# Patient Record
Sex: Female | Born: 1995
Health system: Southern US, Community
[De-identification: ages and names within clinical notes are randomized; demographics above are authoritative.]

## PROBLEM LIST (undated history)

## (undated) DIAGNOSIS — R569 Unspecified convulsions: Secondary | ICD-10-CM

## (undated) DIAGNOSIS — E119 Type 2 diabetes mellitus without complications: Secondary | ICD-10-CM

## (undated) HISTORY — PX: FACIAL RECONSTRUCTION SURGERY: SHX631

---
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
## 2016-06-29 ENCOUNTER — Encounter (HOSPITAL_COMMUNITY): Payer: Self-pay | Admitting: Obstetrics and Gynecology

## 2016-07-05 ENCOUNTER — Encounter (HOSPITAL_COMMUNITY): Payer: Self-pay

## 2016-07-05 ENCOUNTER — Ambulatory Visit (HOSPITAL_COMMUNITY): Admission: RE | Admit: 2016-07-05 | Payer: Self-pay | Source: Ambulatory Visit

## 2016-07-08 ENCOUNTER — Ambulatory Visit (HOSPITAL_COMMUNITY)
Admission: RE | Admit: 2016-07-08 | Discharge: 2016-07-08 | Disposition: A | Discharge status: Home / Self Care | Payer: Medicaid Other | Source: Ambulatory Visit | Attending: Obstetrics and Gynecology | Admitting: Obstetrics and Gynecology

## 2016-07-08 ENCOUNTER — Encounter (HOSPITAL_COMMUNITY): Payer: Self-pay

## 2016-07-08 VITALS — BP 118/86 | HR 102 | Wt 150.6 lb

## 2016-07-08 DIAGNOSIS — R569 Unspecified convulsions: Secondary | ICD-10-CM | POA: Diagnosis not present

## 2016-07-08 DIAGNOSIS — Z3A11 11 weeks gestation of pregnancy: Secondary | ICD-10-CM

## 2016-07-08 DIAGNOSIS — O99283 Endocrine, nutritional and metabolic diseases complicating pregnancy, third trimester: Secondary | ICD-10-CM | POA: Diagnosis not present

## 2016-07-08 DIAGNOSIS — Z880 Allergy status to penicillin: Secondary | ICD-10-CM | POA: Diagnosis not present

## 2016-07-08 DIAGNOSIS — E063 Autoimmune thyroiditis: Secondary | ICD-10-CM | POA: Insufficient documentation

## 2016-07-08 DIAGNOSIS — O24911 Unspecified diabetes mellitus in pregnancy, first trimester: Secondary | ICD-10-CM

## 2016-07-08 DIAGNOSIS — O24111 Pre-existing diabetes mellitus, type 2, in pregnancy, first trimester: Secondary | ICD-10-CM | POA: Insufficient documentation

## 2016-07-08 DIAGNOSIS — Z87442 Personal history of urinary calculi: Secondary | ICD-10-CM | POA: Diagnosis not present

## 2016-07-08 DIAGNOSIS — E119 Type 2 diabetes mellitus without complications: Secondary | ICD-10-CM | POA: Diagnosis not present

## 2016-07-08 DIAGNOSIS — O99351 Diseases of the nervous system complicating pregnancy, first trimester: Secondary | ICD-10-CM | POA: Diagnosis not present

## 2016-07-08 DIAGNOSIS — Z794 Long term (current) use of insulin: Secondary | ICD-10-CM | POA: Diagnosis not present

## 2016-07-08 HISTORY — DX: Type 2 diabetes mellitus without complications: E11.9

## 2016-07-08 HISTORY — DX: Unspecified convulsions: R56.9

## 2016-07-08 NOTE — Addendum Note (Signed)
Encounter addended by: Ledon SnareBrian Khylah Kendra, MD on: 07/08/2016  4:57 PM<BR>     Documentation filed: Notes Section

## 2016-07-08 NOTE — Progress Notes (Signed)
MFM Consult  20 year old, G3P0020, currently at 11 weeks 3 days gestation with EDD 01/24/2017 seen today for consultation for her diabetes  Past Medical History  Diagnosis Date  . Diabetes mellitus without complication (HCC)   . Seizures [facility](HCC)    Past Surgical History  Procedure Laterality Date  . Facial reconstruction surgery     OB History    Gravida Para Term Preterm AB TAB SAB Ectopic Multiple Living   3    2  2         Social History   Social History  . Marital Status: Single    Spouse Name: N/A  . Number of Children: N/A  . Years of Education: N/A   Occupational History  . Not on file.   Social History Main Topics  . Smoking status: Never Smoker   . Smokeless tobacco: Not on file  . Alcohol Use: No  . Drug Use: No  . Sexual Activity: Not on file   Other Topics Concern  . Not on file   Social History Narrative   Allergies  Allergen Reactions  . Benadryl [Diphenhydramine]   . Humalog [Insulin Lispro]   . Penicillins   . Seroquel [Quetiapine]   . Toradol [Ketorolac Tromethamine]    No prenatal records supplied by her providers office for the visit today.   Impression / Recommendations #1 Diabetes in pregnancy - She notes she was admitted to the hospital last week in [place] for diabetic education and patterning. She states she has felt great having her blood sugars under control for the first real time in her life.  - She states her HgbA1c was 9.5 when in the hospital. We discussed that this is associated with a significant risk of birth defects in the developing baby and that she should have a detailed fetal anatomic survey at ~[redacted] weeks gestation. - She is not sure if her providers will do this or if they will want it here. We would be glad to help schedule this if they desire the ultrasound here.  - She notes onset of diabetes at age 20 and regular poor glycemic control.  - She is aware of glucose targets for pregnancy and reports fasting glucose levels of  90-100 and 2-hour postprandials of 100-120. She is currently on levimer 35 units SQ BID and 17 units Humilin R SQ with meals - She will need an ophthamology examination, EKG and a creatinine with a baseline 24-hour urine for total protein. Also consider baseline preeclampsia labs. - She has had a formal dating scan and will set up a fetal anatomic survey as noted above - Serial monthly ultrasounds from ~24 weeks for evaluation of fetal growth and amniotic fluid volume - Start daily maternal assessment of fetal movement at ~[redacted] weeks gestation with immediate provider contact for decreased or cessation of fetal movement.  - Antenatal testing starting ~[redacted] weeks gestation  - Consider delivery at ~39 weeks if still undelivered #2 Seizures  - She has not been formally been evaluated or diagnosed with this. She notes onset of seizures ~1 year ago, when under extreme stress. She notes in the beginning, she would zone out when she had a seizure. Then they progressed to shaking. She denies loss of urine or stool with the episodes that were occuring up to 6 times a day. She was treating them with marijuana which seemed to make them less frequent. She has not had a seizure since April 2017 and hopes that they have gone away  with pregnancy. If they recur, it may be good to have a formal evaluation and treatment plan laid out by a neurologist.  #3 History of frequent UTI and kidney stones - She notes that her NOB urine culture was negative - Check urine cultures on an ~ monthly basis during pregnancy #4 Aneuploidy screening - She states she will be getting a nuchal translucency evaluation at her providers office next week.  #5 Hashimoto's thyroiditis - She notes a history of this and has not had a recent TSH. Would check this and treat as appropriate.   Questions appear answered to her satisfaction. Precautions for the above given.

## 2016-07-08 NOTE — Progress Notes (Signed)
Spent greater than 1/2 of 50 minute visit for MFM Consult face to face counseling

## 2016-08-25 ENCOUNTER — Other Ambulatory Visit (HOSPITAL_COMMUNITY): Payer: Self-pay | Admitting: Obstetrics and Gynecology

## 2016-08-25 DIAGNOSIS — Z3689 Encounter for other specified antenatal screening: Secondary | ICD-10-CM

## 2016-08-25 DIAGNOSIS — Z3A18 18 weeks gestation of pregnancy: Secondary | ICD-10-CM

## 2016-08-26 ENCOUNTER — Ambulatory Visit (HOSPITAL_COMMUNITY)
Admission: RE | Admit: 2016-08-26 | Discharge: 2016-08-26 | Disposition: A | Discharge status: Home / Self Care | Payer: Medicaid Other | Source: Ambulatory Visit | Attending: Obstetrics and Gynecology | Admitting: Obstetrics and Gynecology

## 2016-08-26 ENCOUNTER — Encounter (HOSPITAL_COMMUNITY): Payer: Self-pay

## 2016-08-26 DIAGNOSIS — O358XX0 Maternal care for other (suspected) fetal abnormality and damage, not applicable or unspecified: Secondary | ICD-10-CM

## 2016-08-26 DIAGNOSIS — O350XX0 Maternal care for (suspected) central nervous system malformation in fetus, not applicable or unspecified: Secondary | ICD-10-CM

## 2016-08-26 DIAGNOSIS — O3509X0 Maternal care for (suspected) other central nervous system malformation or damage in fetus, not applicable or unspecified: Secondary | ICD-10-CM

## 2016-08-26 DIAGNOSIS — O35AXX0 Maternal care for other (suspected) fetal abnormality and damage, fetal facial anomalies, not applicable or unspecified: Secondary | ICD-10-CM

## 2016-08-26 DIAGNOSIS — Z315 Encounter for genetic counseling: Secondary | ICD-10-CM | POA: Insufficient documentation

## 2016-08-26 DIAGNOSIS — Z3689 Encounter for other specified antenatal screening: Secondary | ICD-10-CM

## 2016-08-26 DIAGNOSIS — Z3A18 18 weeks gestation of pregnancy: Secondary | ICD-10-CM | POA: Insufficient documentation

## 2016-08-26 NOTE — Progress Notes (Signed)
[facility] CONSULT  Patient Name: Victoria Quinn Medical Record Number:  [id number] Date of Birth: 10/08/1996 Requesting Physician Name:  Sharlynn Oliphant, DO Date of Service: 08/26/2016  Chief Complaint Cleft lip/palate  History of Present Illness Victoria Quinn was seen today secondary to recent discovery of a cleft lip/palate on an ultrasound performed at her primary OBs office at the request of Sharlynn Oliphant, DO.  It also showed enlarged lateral ventricles.  The patient is a 20 y.o. G3P0020,at 70w3dwith an EDD of 01/24/2017, by Ultrasound dating method.  She also has long standing diabetes and [facility].  She reports her fastings and 2 hour post-prandials have been elevated most of the time, some of them have been greater than 200.  She has no other acute issues or complaints at this time.  Review of Systems Pertinent items are noted in HPI.  Patient History OB History  Gravida Para Term Preterm AB Living  3       2 0  SAB TAB Ectopic Multiple Live Births  2            # Outcome Date GA Lbr Len/2nd Weight Sex Delivery Anes PTL Lv  3 Current           2 SAB           1 SAB               Past Medical History:  Diagnosis Date  . Diabetes mellitus without complication ([facility]   . Seizures ([facility]     Past Surgical History:  Procedure Laterality Date  . FACIAL RECONSTRUCTION SURGERY      Social History   Social History  . Marital status: Single    Spouse name: N/A  . Number of children: N/A  . Years of education: N/A   Social History Main Topics  . Smoking status: Never Smoker  . Smokeless tobacco: Not on file  . Alcohol use No  . Drug use: No  . Sexual activity: Not on file   Other Topics Concern  . Not on file   Social History Narrative  . No narrative on file    No family history on file. In addition, the patient has no family history of mental retardation, birth defects, or genetic  diseases.  Physical Examination There were no vitals filed for this visit. General appearance - alert, well appearing, and in no distress Mental status - alert, oriented to person, place, and time Abdomen - soft, nontender, nondistended, no masses or organomegaly Extremities - no pedal edema noted  Assessment and Recommendations 1.  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  In addition, bilateral ventriculomegaly is seen (right 1.09 cm, left 1.14 cm). As these are the only abnormal findings on today's ultrasound, most likely the cleft lip/palate is isolated and not a part of a genetic syndrome or disorder.  However, this cannot be ruled out.  Later in pregnancy she may benefit from a prenatal consult with Pediatric ENT or Plastic Surgery.  She has met with our genetic counselor and has decided to proceed with MaterniGenome and expanded carrier screening.  Blood was drawn for those tests today. 2.  Diabetes.  Ms. TDuke Salviahas very poor glycemic control based on review of her glucose log.  It is bad enough that I think admission to the hospital for  more rapid insulin adjustments is the safest course of action.  Continuing with her current level of glycemic control carries the risk of DKA and fetal loss.  [name] declines admission at this time.  I urged her to discuss her diabetes management with her primary OB next visit (which is next week) and reconsider hospital admission.  Also, [name] clearly needs an endocrinologist.  I have placed a referral to an endocrinologist here in [place] as she has had difficulty getting and appointment with one closer to home.  I spent 30 minutes with [name] of which 50% was face-to-face counseling.  Thank you for referring [name].  Please do not hesitate to contact us with questions.   Jolyn Lent, MD

## 2016-08-29 ENCOUNTER — Other Ambulatory Visit (HOSPITAL_COMMUNITY): Payer: Self-pay | Admitting: *Deleted

## 2016-08-29 DIAGNOSIS — O35AXX0 Maternal care for other (suspected) fetal abnormality and damage, fetal facial anomalies, not applicable or unspecified: Secondary | ICD-10-CM

## 2016-08-29 DIAGNOSIS — O358XX0 Maternal care for other (suspected) fetal abnormality and damage, not applicable or unspecified: Secondary | ICD-10-CM

## 2016-08-29 DIAGNOSIS — O3509X0 Maternal care for (suspected) other central nervous system malformation or damage in fetus, not applicable or unspecified: Secondary | ICD-10-CM | POA: Insufficient documentation

## 2016-08-29 DIAGNOSIS — O350XX0 Maternal care for (suspected) central nervous system malformation in fetus, not applicable or unspecified: Secondary | ICD-10-CM | POA: Insufficient documentation

## 2016-08-29 NOTE — Progress Notes (Signed)
Genetic Counseling  High-Risk Gestation Note  Appointment Date:  08/26/2016 Referred By: Victoria Oliphant, DO Date of Birth:  1996-03-25   Pregnancy History: G3P0020 Estimated Date of Delivery: 01/24/17 Estimated Gestational Age: 49w3dAttending; Victoria Lent MD   I met with Ms. HRaynelle Charyfor genetic counseling because of previous ultrasound finding of cleft lip and mild ventriculomegaly in the patient's OB office.   In summary:  Discussed ultrasound findings in detail  Cleft lip and palate, mild ventriculomegaly  Patient has maternal grandfather with cleft lip; father of pregnancy reportedly has possible incomplete cleft lip  Patient has poorly controlled diabetes mellitus  Reviewed causes may include isolated, syndromic, genetic, chromosomal, environmental, multifactorial  Reviewed options for additional screening  NIPS- elected to pursue today ([place]  Fetal Echocardiogram-scheduled 09/16/16  Ongoing ultrasound- follow-up scheduled 09/23/16  Reviewed options for diagnostic testing, including risks, benefits, limitations and alternatives  Patient declined amniocentesis at this time  Reviewed family history concerns  We began by reviewing the ultrasound in detail. Ultrasound performed today visualized cleft lip and palate and mild, bilateral ventriculomegaly. Complete ultrasound results reported under separate cover. The patient was previously seen for MFM consultation earlier in pregnancy to discuss management of diabetes in pregnancy. The patient's medical records indicate that her diabetes has been in poor control throughout the pregnancy.   Both family histories were reviewed and found to be contributory for cleft lip. The patient reported her maternal grandfather has a history of cleft lip (unsure whether or not palate was also involved), and the father of the pregnancy was described to have a possible incomplete cleft lip, not requiring surgical  correction. The patient's grandfather reportedly also has a history of seizures and schizophrenia. The father of the pregnancy has scoliosis and is otherwise healthy. A specific etiology is not known for their cleft lips.   We discussed the ultrasound finding of cleft lip and palate. In normal embryological development, the fetal lip usually closes by 7-[redacted] weeks gestation and the fetal palate usually closes by [redacted] weeks gestation.  When parts of these structures do not fuse properly, cleft lip and/or palate (CL/P) results.  CL/P is twice as common in males as it is in females. Approximately 25% (1 in 4) of all cleft lip and/or palate (CL/P) cases are cleft lip only, 50% (1 in 2) are cleft lip and palate, and 25% (1 in 4) are cleft palate only.  The incidence of CL/P varies in different ethnic populations; it occurs in approximately 1 in 176,000Caucasian births.  In addition to ethnicity, other factors may increase the chance of a CL/P including some prenatal exposures, maternal diabetes, alcohol and drug use, cigarette smoking, or folic acid deficiency.  CL/P is most often an isolated condition, but can be present in combination with other birth defects possibly as part of a genetic syndrome. Approximately, 7-13% of individuals with a cleft lip and 11-14% of individuals with a cleft lip and palate are born with additional birth defects.  Many genetic syndromes are associated with cleft lip and/or palate which may not be identified on ultrasound and would not be detected by amniocentesis.  For this reason, a genetics evaluation may be recommended sometime after birth in order to assess for an underlying genetic syndrome.  When there is no syndrome as the cause, then the cleft lip or palate is typically suspected to be caused by a combination of genetic and environmental factors (multifactorial inheritance).   We discussed that ventriculomegaly refers to  an increased measurement of the lateral ventricles in the  brain greater than 10 mm. Possible causes for mild ventriculomegaly include overproduction of cerebrospinal fluid, a blockage in a duct causing the fluid to accumulate, an intrauterine infection, or a variation of normal. We discussed that once ventriculomegaly is identified in pregnancy, follow-up ultrasounds are offered to assess for progression of ventriculomegaly. Following delivery, post-natal studies may be required in order to track progress and assess for any other differences in brain anatomy. It was discussed that surgical intervention may be required in some cases of ventriculomegaly. Most cases of isolated mild ventriculomegaly do not have an identified cause or associated condition, tend to regress with time, and have no resulting health or learning problems. However, it is possible that this finding may be seen in association with other ultrasound differences or a genetic condition. There is also a slightly increased chance of developmental delays. We discussed that the presence of ventriculomegaly is associated with an increased risk for aneuploidy, specifically Down syndrome. We reviewed Victoria Quinn previously had first trimester screening, which reduced the risks for fetal Down syndrome, trisomy 57 and trisomy 13 to less than 1 in 10,000. We reviewed the sensitivity and specificity of this screening test.    We reviewed chromosomes and genes, and examples of chromosome conditions. We reviewed an available screening option, noninvasive prenatal testing (NIPT)/prenatal cell free DNA testing.  Specifically, we discussed that NIPT analyzes cell free placental DNA found in the maternal circulation. Specifically we discussed the option of MaterniTGenome through [employer]. This screen assesses cell free DNA from each chromosome in maternal blood and can detect deletions or duplications that are greater than or equal to 7Mb, in addition to select microdeletion syndromes (22q11, 15q11, 11q23, 8q24,  5p15, 4p16, and 1p36). The sensitivity of this test for gains/losses of chromosome material greater than 7Mb is reported to be >95%. We discussed limitations and benefits of this screen including that it is not diagnostic and cannot assess for all chromosome conditions nor assess for single gene conditions.  We discussed the option of amniocentesis, including the 1 in 643-329 risk for complications including spontaneous pregnancy loss.  They understand that amniocentesis allows evaluation of the fetal chromosomes, but cannot detect all genetic conditions. We discussed the testing option of chromosomal microarray that could be performed via amniocentesis or postnatally. A chromosomal microarray is a DNA analysis that looks for changes in copy number of genetic material. A control sample of DNA is used for comparison at thousands of loci across the genome. Microarray analysis allows for the detection of genetic deletions and duplications that are 518 times smaller than those identified by routine chromosome analysis.  Microarray analysis is able to detect deletions or duplications of the tested regions, thus it would identify aneuploidy. It would, however, not identify a structural rearrangement or smaller deletions, duplications or point mutations. Thus, while microarray is designed to identify a large number of conditions, it can not rule out all type of mental retardation, birth defects or other genetic conditions.   Specifically, we discussed that single gene conditions are difficult to diagnose prenatally unless a specific condition is suspected based on additional ultrasound findings or family history. We discussed the risks, limitations, and benefits of each screening and testing option. After thoughtful consideration, Victoria Quinn elected to pursue noninvasive prenatal screening (MaterniTGenome through [employer]). She declined amniocentesis at this time given the associated risk for complications.  She understands that ultrasound cannot rule out all birth defects or  genetic syndromes. The patient was advised of this limitation and states she still does not want diagnostic testing at this time.   Given the patient's history of poorly controlled diabetes mellitus in pregnancy and the reported family history of cleft lip, we discussed the high suspicion for multifactorial etiology for the ultrasound findings. We discussed the option of having their baby evaluated by a Medical Geneticist is also available so that a potential diagnosis for the baby and, ultimately, a recurrence risk for this couple can be made. Without further information, we may not be able to answer questions related to why this happened or the recurrence risk for future pregnancies.    We reviewed that the overall prognosis depends upon the underlying etiology and/or the presence or absence of additional medical issues. We discussed that the legal limit for termination of pregnancy in [place] is [redacted] weeks gestation.Given that the finding of cleft lip +/- palate is associated with an increased risk for additional structural anomalies and the patient's personal history of diabetes, we discussed the option of a fetal echocardiogram. Fetal echocardiogram is scheduled through [facility]. Additionally, we discussed the option of meeting with a pediatric plastic and reconstructive surgeon to discussed management and treatment of cleft lip and palate. We discussed that there are physicians from [facility] who provide consultative services in [place] and discussed additional options throughout the state. The patient plans to discuss this option with the father of the pregnancy and expressed interest in a prenatal consultation once they decide upon likely place of treatment.  Follow-up ultrasound was scheduled for 09/23/16.  Both family histories were otherwise contributory for  intellectual disability. The patient reported a nephew for the father of the pregnancy (his brother's son) has intellectual disability and attention deficit hyperactivity disorder (ADHD), attributed to a history of physical abuse. Additionally, the father of the pregnancy has a maternal aunt with intellectual disability. The cause is not known. She is unable to live independently. She has a daughter who does not have intellectual disability. Victoria Quinn also reported a paternal uncle to herself with mild intellectual disability, attributed to a shelf falling on his head in childhood.  Victoria Quinn was counseled that there are many different causes of intellectual disabilities including environmental, multifactorial, and genetic etiologies.  We discussed that a specific diagnosis for intellectual disability can be determined in approximately 50% of these individuals.  In the remaining 50% of individuals, a diagnosis may never be determined.  Regarding genetic causes, we discussed that chromosome aberrations (aneuploidy, deletions, duplications, insertions, and translocations) are responsible for a small percentage of individuals with intellectual disability.  Many individuals with chromosome aberrations have additional differences, including congenital anomalies or minor dysmorphisms.  Likewise, single gene conditions are the underlying cause of intellectual delay in some families.  We discussed that many gene conditions have intellectual disability as a feature, but also often include other physical or medical differences. In addition, we discussed the option of this family having an evaluation by a medical geneticist to help determine the cause of the familial intellectual disability. We discussed that without more specific information, it is difficult to provide an accurate risk assessment.  Further genetic counseling is warranted if more information is obtained.  Victoria Quinn was provided with written  information regarding cystic fibrosis (CF), spinal muscular atrophy (SMA) and hemoglobinopathies including the carrier frequency, availability of carrier screening and prenatal diagnosis if indicated.  In addition, we discussed that  CF and hemoglobinopathies are routinely screened for as part of the [place] newborn screening panel.  There are a myriad of genetic disorders that occur more frequently in specific ethnic groups, those which can be traced to particular geographic locations. We discussed that although these genetic disorders are much more prevalent in specific ethnic groups, as families are becoming increasingly multiracial and multicultural, these conditions can occur in anyone from any race or ethnicity. For this reason, we discussed the availability of ethnic specific genetic carrier screening, professional society (ACOG) recommended carrier testing, and pan-ethnic carrier screening.   We reviewed that ACOG currently recommends that all patients be offered carrier screening for cystic fibrosis, spinal muscular atrophy and hemoglobinopathies. In addition, they were counseled that there are a variety of genetic screening laboratories that have pan-ethnic, or expanded, carrier screening panels, which evaluate carrier status for a wide range of genetic conditions. Some of these conditions are severe and actionable, but also rare; others occur more commonly, but are less severe. We discussed that testing options range from screening for a single condition to panels of more than 170 autosomal or X-linked genetic conditions. We reviewed that the prevalence of each condition varies (and often varies with ethnicity). Thus the couples' background risk to be a carrier for each of these various conditions would range, and in some cases be very low or unknown. Similarly, the detection rate varies with each condition and also varies in some cases with ethnicity, ranging from greater than 99% (in the case of  hemoglobinopathies) to unknown. We reviewed that a negative carrier screen would thus reduce, but not eliminate the chance to be a carrier for these conditions. For some conditions included on specific pan-ethnic carrier screening panels, the pre-test carrier frequency and/or the detection rate is unknown. Thus, for some conditions, the exact reduction of risk with a negative carrier screening result may not be able to be quantified. We reviewed that in the event that one partner is found to be a carrier for one or more conditions, carrier screening would be available to the partner for those conditions. We discussed the risks, benefits, and limitations of carrier screening. After thoughtful consideration of their options, Victoria Quinn elected to have the expanded carrier screening panel through [facility] laboratory. We discussed that results will be available in approximately 2 weeks.   Victoria Quinn denied exposure to environmental toxins or chemical agents. She denied the use of alcohol, tobacco or street drugs. She denied significant viral illnesses during the course of her pregnancy. Her medical and surgical histories were contributory for diabetes, for which she currently is treated with medication. Available records indicate the patient has been in poor glycemic control in pregnancy. We discussed the fact that women who have insulin dependent diabetes are at an increased risk to have a baby with a birth defect.  The increase in risk correlates with the level of blood sugar control during the pregnancy, particularly during organogenesis.  The increase in risk is for any type of birth defect but is greatest for heart, limb, and neural tube defects.  The risk could be as high as 6-10% for individuals whose blood sugars are not well-controlled, but lower for women who have good blood sugar control throughout pregnancy. The patient also reported a history of seizures but reported that she has never been  seen by neurology and has not required medication for treatment.   I counseled Victoria Quinn regarding the above risks and available  options.  The approximate face-to-face time with the genetic counselor was 45 minutes.  Chipper Oman, MS Certified Genetic Counselor 08/29/2016

## 2016-08-30 ENCOUNTER — Encounter (HOSPITAL_COMMUNITY): Payer: Self-pay

## 2016-08-30 DIAGNOSIS — Z3A18 18 weeks gestation of pregnancy: Secondary | ICD-10-CM | POA: Insufficient documentation

## 2016-09-09 ENCOUNTER — Telehealth (HOSPITAL_COMMUNITY): Payer: Self-pay | Admitting: MS"

## 2016-09-09 NOTE — Telephone Encounter (Signed)
Attempted to call patient regarding results of NIPS and expanded carrier screening, which are within normal range. Left message for patient to return call.   Victoria BraunKaren Shawni Volkov 09/09/2016 2:17 PM

## 2016-09-13 NOTE — Telephone Encounter (Signed)
Attempted to call patient regarding results of NIPS and expanded carrier screening, which are within normal range. Left message for patient to return call.   Clydie BraunKaren Thos Matsumoto 09/13/2016 4:40 PM

## 2016-09-15 ENCOUNTER — Other Ambulatory Visit (HOSPITAL_COMMUNITY): Payer: Self-pay

## 2016-09-23 ENCOUNTER — Ambulatory Visit (HOSPITAL_COMMUNITY)
Admission: RE | Admit: 2016-09-23 | Discharge: 2016-09-23 | Disposition: A | Discharge status: Home / Self Care | Payer: Medicaid Other | Source: Ambulatory Visit | Attending: Obstetrics and Gynecology | Admitting: Obstetrics and Gynecology

## 2016-09-23 ENCOUNTER — Encounter (HOSPITAL_COMMUNITY): Payer: Self-pay

## 2016-09-23 DIAGNOSIS — O350XX0 Maternal care for (suspected) central nervous system malformation in fetus, not applicable or unspecified: Secondary | ICD-10-CM

## 2016-09-23 DIAGNOSIS — O35AXX0 Maternal care for other (suspected) fetal abnormality and damage, fetal facial anomalies, not applicable or unspecified: Secondary | ICD-10-CM

## 2016-09-23 DIAGNOSIS — O358XX0 Maternal care for other (suspected) fetal abnormality and damage, not applicable or unspecified: Secondary | ICD-10-CM

## 2016-09-23 DIAGNOSIS — O3509X0 Maternal care for (suspected) other central nervous system malformation or damage in fetus, not applicable or unspecified: Secondary | ICD-10-CM

## 2017-05-10 ENCOUNTER — Encounter (HOSPITAL_COMMUNITY): Payer: Self-pay

## 2022-04-28 ENCOUNTER — Encounter: Payer: Self-pay | Admitting: Neurology

## 2022-05-26 ENCOUNTER — Encounter: Payer: Self-pay | Admitting: Neurology

## 2022-05-26 ENCOUNTER — Ambulatory Visit: Payer: Medicaid Other | Admitting: Neurology

## 2022-05-26 VITALS — BP 128/75 | HR 113 | Ht 65.0 in | Wt 213.0 lb

## 2022-05-26 DIAGNOSIS — R569 Unspecified convulsions: Secondary | ICD-10-CM | POA: Diagnosis not present

## 2022-05-26 NOTE — Patient Instructions (Signed)
Good to meet you.  Schedule MRI brain with and without contrast  2. Schedule routine EEG. If normal, we will plan for inpatient video EEG monitoring  3. Start looking into cognitive behavioral therapists  4. Follow-up after tests, call for any changes
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
## 2022-05-30 ENCOUNTER — Telehealth: Payer: Self-pay | Admitting: Neurology

## 2022-05-30 NOTE — Telephone Encounter (Signed)
Xanax is on her med list, she can take the Xanax before the MRI.

## 2022-05-30 NOTE — Telephone Encounter (Signed)
Patient has an MRI Saturday. She stated she needs something to calm her sent in.

## 2022-05-30 NOTE — Telephone Encounter (Signed)
Pt called no answer per DPR left a voice mail Xanax is on her med list, she can take the Xanax 30 minutes  before the MRI. She was advised that she would need a driver while taken the xanax

## 2022-06-04 ENCOUNTER — Ambulatory Visit
Admission: RE | Admit: 2022-06-04 | Discharge: 2022-06-04 | Disposition: A | Discharge status: Home / Self Care | Payer: Medicaid Other | Source: Ambulatory Visit | Attending: Neurology | Admitting: Neurology

## 2022-06-04 DIAGNOSIS — R569 Unspecified convulsions: Secondary | ICD-10-CM

## 2022-06-04 MED ORDER — GADOBENATE DIMEGLUMINE 529 MG/ML IV SOLN
20.0000 mL | Freq: Once | INTRAVENOUS | Status: AC | PRN
  Administered 2022-06-04: 20 mL via INTRAVENOUS

## 2022-06-29 ENCOUNTER — Telehealth: Payer: Self-pay

## 2022-06-29 NOTE — Telephone Encounter (Signed)
Pt called an informed that the brain MRI did not show any tumor, stroke, or bleed. No changes in the brain to cause her symptoms. Pt advised to check her voice mail for missed calls form hospital to schedule EEG

## 2022-06-29 NOTE — Telephone Encounter (Signed)
-----   Message from Van Clines, MD sent at 06/29/2022 12:47 PM EDT ----- Pls let her know the brain MRI did not show any tumor, stroke, or bleed. No changes in the brain to cause her symptoms. Has she scheduled EEG? Does order need to be sent to the hospital? Thanks

## 2022-07-12 ENCOUNTER — Inpatient Hospital Stay (HOSPITAL_COMMUNITY): Admission: RE | Admit: 2022-07-12 | Payer: Medicaid Other | Source: Ambulatory Visit

## 2022-10-20 ENCOUNTER — Ambulatory Visit (INDEPENDENT_AMBULATORY_CARE_PROVIDER_SITE_OTHER): Payer: No Typology Code available for payment source | Admitting: Neurology

## 2022-10-20 DIAGNOSIS — R569 Unspecified convulsions: Secondary | ICD-10-CM

## 2022-10-20 NOTE — Progress Notes (Signed)
EEG complete - results pending 

## 2022-10-25 NOTE — Procedures (Signed)
ELECTROENCEPHALOGRAM REPORT  Date of Study: 10/20/2022  Patient's Name: Victoria Quinn MRN: [id number] Date of Birth: [birth year]  Referring Provider: Dr. Ellouise Newer  Clinical History: This is a 26 year old woman with recurrent episodes of staring, gaps in time, convulsions. EEG for classification.  Medications: Xanax, Gabapentin, Zyprexa, Baclofen, Lexapro  Technical Summary: A multichannel digital EEG recording measured by the international 10-20 system with electrodes applied with paste and impedances below 5000 ohms performed in our laboratory with EKG monitoring in an awake and drowsy patient.  Hyperventilation and photic stimulation were performed.  The digital EEG was referentially recorded, reformatted, and digitally filtered in a variety of bipolar and referential montages for optimal display.    Description: The patient is awake and drowsy during the recording.  During maximal wakefulness, there is a symmetric, medium voltage 11-12 Hz posterior dominant rhythm that attenuates with eye opening.  The record is symmetric.  There is an excess amount of diffuse low voltage beta activity seen throughout the recording. During drowsiness, there is an increase in theta slowing of the background. Sleep was not captured. Hyperventilation and photic stimulation did not elicit any abnormalities.  There were no epileptiform discharges or electrographic seizures seen.    EKG lead was unremarkable.  Impression: This awake and drowsy EEG is normal except for excess amount of diffuse low voltage beta activity.  Clinical Correlation: Diffuse low voltage beta activity is commonly seen with sedating medications such as benzodiazepines.  The absence of epileptiform discharges does not exclude a clinical diagnosis of epilepsy.  If further clinical questions remain, prolonged EEG may be helpful.  Clinical correlation is advised.   Ellouise Newer, M.D.

## 2022-11-18 ENCOUNTER — Telehealth: Payer: Self-pay

## 2022-11-18 DIAGNOSIS — R569 Unspecified convulsions: Secondary | ICD-10-CM

## 2022-11-18 NOTE — Telephone Encounter (Signed)
-----   Message from Van Clines, MD sent at 11/03/2022 12:25 PM EST ----- Pls let her know the EEG was normal. If she is still having a lot of the seizures, I would like to proceed with the inpatient EEG study we had discussed. If she agrees, pls refer to Dr. Melynda Ripple for EMU monitoring. Thanks

## 2023-01-23 ENCOUNTER — Inpatient Hospital Stay (HOSPITAL_COMMUNITY)
Admission: RE | Admit: 2023-01-23 | Payer: No Typology Code available for payment source | Source: Ambulatory Visit | Admitting: Neurology

## 2023-02-13 ENCOUNTER — Encounter (HOSPITAL_COMMUNITY): Payer: No Typology Code available for payment source

## 2023-02-13 ENCOUNTER — Inpatient Hospital Stay (HOSPITAL_COMMUNITY)
Admission: RE | Admit: 2023-02-13 | Discharge: 2023-02-17 | DRG: 880 | Disposition: A | Discharge status: Home / Self Care | Payer: No Typology Code available for payment source | Attending: Neurology | Admitting: Neurology

## 2023-02-13 ENCOUNTER — Other Ambulatory Visit: Payer: Self-pay

## 2023-02-13 ENCOUNTER — Inpatient Hospital Stay (HOSPITAL_COMMUNITY): Payer: No Typology Code available for payment source

## 2023-02-13 ENCOUNTER — Encounter (HOSPITAL_COMMUNITY): Payer: Self-pay | Admitting: Neurology

## 2023-02-13 DIAGNOSIS — M792 Neuralgia and neuritis, unspecified: Secondary | ICD-10-CM | POA: Diagnosis present

## 2023-02-13 DIAGNOSIS — M25551 Pain in right hip: Secondary | ICD-10-CM | POA: Diagnosis not present

## 2023-02-13 DIAGNOSIS — Z833 Family history of diabetes mellitus: Secondary | ICD-10-CM | POA: Diagnosis not present

## 2023-02-13 DIAGNOSIS — R519 Headache, unspecified: Secondary | ICD-10-CM | POA: Diagnosis present

## 2023-02-13 DIAGNOSIS — F445 Conversion disorder with seizures or convulsions: Secondary | ICD-10-CM | POA: Diagnosis present

## 2023-02-13 DIAGNOSIS — R569 Unspecified convulsions: Secondary | ICD-10-CM | POA: Diagnosis not present

## 2023-02-13 DIAGNOSIS — F4310 Post-traumatic stress disorder, unspecified: Secondary | ICD-10-CM | POA: Diagnosis present

## 2023-02-13 DIAGNOSIS — Z794 Long term (current) use of insulin: Secondary | ICD-10-CM

## 2023-02-13 DIAGNOSIS — E1165 Type 2 diabetes mellitus with hyperglycemia: Secondary | ICD-10-CM | POA: Diagnosis present

## 2023-02-13 DIAGNOSIS — E785 Hyperlipidemia, unspecified: Secondary | ICD-10-CM | POA: Diagnosis present

## 2023-02-13 DIAGNOSIS — Z91411 Personal history of adult psychological abuse: Secondary | ICD-10-CM | POA: Diagnosis not present

## 2023-02-13 DIAGNOSIS — M25552 Pain in left hip: Secondary | ICD-10-CM | POA: Diagnosis not present

## 2023-02-13 DIAGNOSIS — Z79899 Other long term (current) drug therapy: Secondary | ICD-10-CM | POA: Diagnosis not present

## 2023-02-13 DIAGNOSIS — F319 Bipolar disorder, unspecified: Secondary | ICD-10-CM | POA: Diagnosis present

## 2023-02-13 DIAGNOSIS — Z7984 Long term (current) use of oral hypoglycemic drugs: Secondary | ICD-10-CM

## 2023-02-13 DIAGNOSIS — Z820 Family history of epilepsy and other diseases of the nervous system: Secondary | ICD-10-CM | POA: Diagnosis not present

## 2023-02-13 DIAGNOSIS — F419 Anxiety disorder, unspecified: Secondary | ICD-10-CM | POA: Diagnosis present

## 2023-02-13 DIAGNOSIS — E871 Hypo-osmolality and hyponatremia: Secondary | ICD-10-CM | POA: Diagnosis present

## 2023-02-13 DIAGNOSIS — Z818 Family history of other mental and behavioral disorders: Secondary | ICD-10-CM

## 2023-02-13 DIAGNOSIS — Z91410 Personal history of adult physical and sexual abuse: Secondary | ICD-10-CM

## 2023-02-13 LAB — COMPREHENSIVE METABOLIC PANEL
ALT: 39 U/L (ref 0–44)
AST: 36 U/L (ref 15–41)
Albumin: 3.8 g/dL (ref 3.5–5.0)
Alkaline Phosphatase: 79 U/L (ref 38–126)
Anion gap: 16 — ABNORMAL HIGH (ref 5–15)
BUN: 7 mg/dL (ref 6–20)
CO2: 20 mmol/L — ABNORMAL LOW (ref 22–32)
Calcium: 8.9 mg/dL (ref 8.9–10.3)
Chloride: 96 mmol/L — ABNORMAL LOW (ref 98–111)
Creatinine, Ser: 0.68 mg/dL (ref 0.44–1.00)
GFR, Estimated: >60 mL/min (ref >60)
Glucose, Bld: 382 mg/dL — ABNORMAL HIGH (ref 70–99)
Potassium: 3.8 mmol/L (ref 3.5–5.1)
Sodium: 132 mmol/L — ABNORMAL LOW (ref 135–145)
Total Bilirubin: 0.4 mg/dL (ref 0.3–1.2)
Total Protein: 6.8 g/dL (ref 6.5–8.1)

## 2023-02-13 LAB — GLUCOSE, CAPILLARY
Glucose-Capillary: 286 mg/dL — ABNORMAL HIGH (ref 70–99)
Glucose-Capillary: 327 mg/dL — ABNORMAL HIGH (ref 70–99)

## 2023-02-13 LAB — CBC WITH DIFFERENTIAL/PLATELET
Abs Immature Granulocytes: 0.02 10*3/uL (ref 0.00–0.07)
Basophils Absolute: 0.1 10*3/uL (ref 0.0–0.1)
Basophils Relative: 1 %
Eosinophils Absolute: 0.1 10*3/uL (ref 0.0–0.5)
Eosinophils Relative: 2 %
HCT: 36.9 % (ref 36.0–46.0)
Hemoglobin: 12.6 g/dL (ref 12.0–15.0)
Immature Granulocytes: 0 %
Lymphocytes Relative: 28 %
Lymphs Abs: 2.3 10*3/uL (ref 0.7–4.0)
MCH: 29.2 pg (ref 26.0–34.0)
MCHC: 34.1 g/dL (ref 30.0–36.0)
MCV: 85.6 fL (ref 80.0–100.0)
Monocytes Absolute: 0.4 10*3/uL (ref 0.1–1.0)
Monocytes Relative: 5 %
Neutro Abs: 5.3 10*3/uL (ref 1.7–7.7)
Neutrophils Relative %: 64 %
Platelets: 303 10*3/uL (ref 150–400)
RBC: 4.31 MIL/uL (ref 3.87–5.11)
RDW: 12 % (ref 11.5–15.5)
WBC: 8.2 10*3/uL (ref 4.0–10.5)
nRBC: 0 % (ref 0.0–0.2)

## 2023-02-13 LAB — MAGNESIUM: Magnesium: 1.4 mg/dL — ABNORMAL LOW (ref 1.7–2.4)

## 2023-02-13 LAB — RAPID URINE DRUG SCREEN, HOSP PERFORMED
Amphetamines: NOT DETECTED
Barbiturates: NOT DETECTED
Benzodiazepines: POSITIVE — AB
Cocaine: NOT DETECTED
Opiates: NOT DETECTED
Tetrahydrocannabinol: POSITIVE — AB

## 2023-02-13 LAB — HIV ANTIBODY (ROUTINE TESTING W REFLEX): HIV Screen 4th Generation wRfx: NONREACTIVE

## 2023-02-13 LAB — PROTIME-INR
INR: 1 (ref 0.8–1.2)
Prothrombin Time: 13.2 seconds (ref 11.4–15.2)

## 2023-02-13 LAB — PHOSPHORUS: Phosphorus: 3.3 mg/dL (ref 2.5–4.6)

## 2023-02-13 MED ORDER — ACETAMINOPHEN 650 MG RE SUPP: 650.0000 mg | RECTAL | Status: DC | PRN

## 2023-02-13 MED ORDER — INSULIN ASPART 100 UNIT/ML IJ SOLN
0.0000 [IU] | Freq: Every day | INTRAMUSCULAR | Status: DC
  Administered 2023-02-13: 4 [IU] via SUBCUTANEOUS

## 2023-02-13 MED ORDER — OLANZAPINE 2.5 MG PO TABS
7.5000 mg | ORAL_TABLET | Freq: Every day | ORAL | Status: DC
  Administered 2023-02-13 – 2023-02-16 (×4): 7.5 mg via ORAL
  Filled 2023-02-13 (×4): qty 3

## 2023-02-13 MED ORDER — INSULIN DETEMIR 100 UNIT/ML ~~LOC~~ SOLN
50.0000 [IU] | Freq: Every day | SUBCUTANEOUS | Status: DC
  Filled 2023-02-13: qty 0.5

## 2023-02-13 MED ORDER — MIDAZOLAM HCL 2 MG/2ML IJ SOLN: 2.0000 mg | INTRAMUSCULAR | Status: DC | PRN

## 2023-02-13 MED ORDER — INSULIN REGULAR HUMAN (CONC) 500 UNIT/ML ~~LOC~~ SOPN
100.0000 [IU] | PEN_INJECTOR | Freq: Two times a day (BID) | SUBCUTANEOUS | Status: DC
  Administered 2023-02-13 – 2023-02-14 (×2): 100 [IU] via SUBCUTANEOUS
  Filled 2023-02-13 (×2): qty 3

## 2023-02-13 MED ORDER — ACETAMINOPHEN 325 MG PO TABS
650.0000 mg | ORAL_TABLET | ORAL | Status: DC | PRN
  Administered 2023-02-14 – 2023-02-17 (×6): 650 mg via ORAL
  Filled 2023-02-13 (×6): qty 2

## 2023-02-13 MED ORDER — INSULIN ASPART 100 UNIT/ML IJ SOLN
0.0000 [IU] | Freq: Three times a day (TID) | INTRAMUSCULAR | Status: DC
  Administered 2023-02-13: 11 [IU] via SUBCUTANEOUS
  Administered 2023-02-14: 15 [IU] via SUBCUTANEOUS
  Administered 2023-02-14: 11 [IU] via SUBCUTANEOUS
  Administered 2023-02-14: 4 [IU] via SUBCUTANEOUS
  Administered 2023-02-15 – 2023-02-16 (×4): 3 [IU] via SUBCUTANEOUS

## 2023-02-13 MED ORDER — INSULIN ASPART 100 UNIT/ML IJ SOLN: 0.0000 [IU] | INTRAMUSCULAR | Status: DC

## 2023-02-13 MED ORDER — ENOXAPARIN SODIUM 40 MG/0.4ML IJ SOSY
40.0000 mg | PREFILLED_SYRINGE | INTRAMUSCULAR | Status: DC
  Administered 2023-02-13 – 2023-02-16 (×4): 40 mg via SUBCUTANEOUS
  Filled 2023-02-13 (×4): qty 0.4

## 2023-02-13 MED ORDER — FENOFIBRATE 160 MG PO TABS
160.0000 mg | ORAL_TABLET | Freq: Every day | ORAL | Status: DC
  Administered 2023-02-13 – 2023-02-16 (×4): 160 mg via ORAL
  Filled 2023-02-13 (×4): qty 1

## 2023-02-13 MED ORDER — PRAZOSIN HCL 1 MG PO CAPS
1.0000 mg | ORAL_CAPSULE | Freq: Every day | ORAL | Status: DC
  Administered 2023-02-13 – 2023-02-16 (×4): 1 mg via ORAL
  Filled 2023-02-13 (×5): qty 1

## 2023-02-13 MED ORDER — SODIUM CHLORIDE 0.9% FLUSH
3.0000 mL | Freq: Two times a day (BID) | INTRAVENOUS | Status: DC
  Administered 2023-02-13 – 2023-02-16 (×7): 3 mL via INTRAVENOUS

## 2023-02-13 MED ORDER — AMITRIPTYLINE HCL 25 MG PO TABS
50.0000 mg | ORAL_TABLET | Freq: Every day | ORAL | Status: DC
  Administered 2023-02-13: 50 mg via ORAL
  Filled 2023-02-13: qty 2

## 2023-02-13 MED ORDER — PAROXETINE HCL 20 MG PO TABS
20.0000 mg | ORAL_TABLET | Freq: Every day | ORAL | Status: DC
  Administered 2023-02-13 – 2023-02-16 (×4): 20 mg via ORAL
  Filled 2023-02-13 (×5): qty 1

## 2023-02-13 MED ORDER — METFORMIN HCL ER 500 MG PO TB24
1000.0000 mg | ORAL_TABLET | Freq: Two times a day (BID) | ORAL | Status: DC
  Administered 2023-02-13 – 2023-02-17 (×8): 1000 mg via ORAL
  Filled 2023-02-13 (×9): qty 2

## 2023-02-13 MED ORDER — MAGNESIUM OXIDE -MG SUPPLEMENT 400 (240 MG) MG PO TABS
800.0000 mg | ORAL_TABLET | Freq: Once | ORAL | Status: AC
  Administered 2023-02-13: 800 mg via ORAL
  Filled 2023-02-13: qty 2

## 2023-02-13 MED ORDER — ATORVASTATIN CALCIUM 10 MG PO TABS
20.0000 mg | ORAL_TABLET | Freq: Every day | ORAL | Status: DC
  Administered 2023-02-13 – 2023-02-16 (×4): 20 mg via ORAL
  Filled 2023-02-13 (×4): qty 2

## 2023-02-13 MED ORDER — LABETALOL HCL 5 MG/ML IV SOLN: 5.0000 mg | INTRAVENOUS | Status: DC | PRN
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
## 2023-02-13 NOTE — Progress Notes (Signed)
  Transition of Care [facility]) Screening Note   Patient Details  Name: Victoria Quinn Date of Birth: [birth year]   Transition of Care [facility]) CM/SW Contact:    Pollie Friar, RN Phone Number: 02/13/2023, 11:10 AM   Pt is from home. She is here for EMU. Transition of Care Department [facility]) has reviewed patient. We will continue to monitor patient advancement through interdisciplinary progression rounds. If new patient transition needs arise, please place a TOC consult.

## 2023-02-13 NOTE — Progress Notes (Signed)
EMU hook-up

## 2023-02-13 NOTE — Progress Notes (Signed)
Inpatient Diabetes Program Recommendations  AACE/ADA: New Consensus Statement on Inpatient Glycemic Control (2015)  Target Ranges:  Prepandial:   less than 140 mg/dL      Peak postprandial:   less than 180 mg/dL (1-2 hours)      Critically ill patients:  140 - 180 mg/dL    Review of Glycemic Control  Latest Reference Range & Units 02/13/23 11:14  Glucose 70 - 99 mg/dL 382 (H)   Diabetes history: DM 1 since age 27- Last A1C=9.8% at MD visit Outpatient Diabetes medications:  Levemir 50 units daily Ozempic 1 mg q 7 days Metformin 1000 mg bid U500 (200-300 units with meals) Current orders for Inpatient glycemic control:  Levemir 50 units daily Metformin 1000 mg bid Inpatient Diabetes Program Recommendations:    Note patient uses U500 insulin with meals.  Spoke with patient at bedside and she states that she thinks she has allergy to Novolog however review of chart states that she has allergy to Humalog.  She states that it causes "kidney pain".  Note that last A1C is 9.8%.  Patient states that she had Dexcom G7 for one day and it got pulled off.  States she is having a difficult time getting insurance to pay.  Will ask for benefits check.   Spoke with pharmacists, [facility].  Consider starting U500 100 units twice daily as well as Novolog 0-20 units tid with meals and HS.    Thanks,  Adah Perl, RN, BC-ADM Inpatient Diabetes Coordinator Pager [phone]  (8a-5p)

## 2023-02-14 ENCOUNTER — Telehealth (HOSPITAL_COMMUNITY): Payer: Self-pay | Admitting: Pharmacy Technician

## 2023-02-14 ENCOUNTER — Other Ambulatory Visit (HOSPITAL_COMMUNITY): Payer: Self-pay

## 2023-02-14 DIAGNOSIS — R569 Unspecified convulsions: Secondary | ICD-10-CM | POA: Diagnosis not present

## 2023-02-14 LAB — GLUCOSE, CAPILLARY
Glucose-Capillary: 275 mg/dL — ABNORMAL HIGH (ref 70–99)
Glucose-Capillary: 302 mg/dL — ABNORMAL HIGH (ref 70–99)
Glucose-Capillary: 186 mg/dL — ABNORMAL HIGH (ref 70–99)
Glucose-Capillary: 165 mg/dL — ABNORMAL HIGH (ref 70–99)

## 2023-02-14 MED ORDER — INSULIN REGULAR HUMAN (CONC) 500 UNIT/ML ~~LOC~~ SOPN
150.0000 [IU] | PEN_INJECTOR | Freq: Two times a day (BID) | SUBCUTANEOUS | Status: DC
  Administered 2023-02-14 – 2023-02-17 (×6): 150 [IU] via SUBCUTANEOUS

## 2023-02-14 MED ORDER — INSULIN DETEMIR 100 UNIT/ML ~~LOC~~ SOLN
50.0000 [IU] | Freq: Every day | SUBCUTANEOUS | Status: DC
  Administered 2023-02-14 – 2023-02-15 (×2): 50 [IU] via SUBCUTANEOUS
  Filled 2023-02-14 (×4): qty 0.5

## 2023-02-14 NOTE — Procedures (Signed)
Patient Name: Victoria Quinn  MRN: [id number]  Epilepsy Attending: Lora Havens  Referring Physician/Provider: Lora Havens, MD  Duration: 02/13/2023 1048 to 02/14/2023 1048  Patient history: 27yo F admitted to EMU for characterization of seizure like episodes.EEG to evaluate for seizure  Level of alertness: Awake, asleep  AEDs during EEG study: None  Technical aspects: This EEG study was done with scalp electrodes positioned according to the 10-20 International system of electrode placement. Electrical activity was reviewed with band pass filter of 1-'70Hz'$ , sensitivity of 7 uV/mm, display speed of 72m/sec with a '60Hz'$  notched filter applied as appropriate. EEG data were recorded continuously and digitally stored.  Video monitoring was available and reviewed as appropriate.  Description: The posterior dominant rhythm consists of 9-10 Hz activity of moderate voltage (25-35 uV) seen predominantly in posterior head regions, symmetric and reactive to eye opening and eye closing. Sleep was characterized by vertex waves, sleep spindles (12 to 14 Hz), maximal frontocentral region.  Physiologic photic driving was seen during photic stimulation.  No EEG changes seen during hyperventilation.  IMPRESSION: This study is within normal limits. No seizures or epileptiform discharges were seen throughout the recording.  A normal interictal EEG does not exclude the diagnosis of epilepsy.   Lanelle Lindo OBarbra Sarks

## 2023-02-14 NOTE — Progress Notes (Addendum)
LTM maint complete - no skin breakdown under: Fp1 Fp2 F3 F4 Service multiple leads. Monitored by [facility]

## 2023-02-14 NOTE — Progress Notes (Signed)
Subjective: NAEO. No seizures. Hd headache after photic stimulation  ROS: negative except above  Examination  Vital signs in last 24 hours: Temp:  [97.7 F (36.5 C)-98.3 F (36.8 C)] 97.8 F (36.6 C) (02/27 0901) Pulse Rate:  [99-108] 99 (02/27 0901) Resp:  [16-19] 17 (02/27 0901) BP: (101-120)/(65-88) 108/75 (02/27 0901) SpO2:  [94 %-96 %] 95 % (02/27 0901)  General: lying in bed, NAD Neuro: AOx3, No aphasia, CN 2-12 grossly intact, spontaneously moving all extremities in bed   Basic Metabolic Panel: Recent Labs  Lab 02/13/23 1114  NA 132*  K 3.8  CL 96*  CO2 20*  GLUCOSE 382*  BUN 7  CREATININE 0.68  CALCIUM 8.9  MG 1.4*  PHOS 3.3    CBC: Recent Labs  Lab 02/13/23 1114  WBC 8.2  NEUTROABS 5.3  HGB 12.6  HCT 36.9  MCV 85.6  PLT 303    Coagulation Studies: Recent Labs    02/13/23 1114  LABPROT 13.2  INR 1.0    Imaging No new brain imaging   ASSESSMENT AND PLAN: 27yo F admitted to EMU for characterization of seizure like episodes.   Seizure like episodes - continue Video EEG for characterization of spells - hold xanax and gabapentin for spell provocation  - HV and photic stimulation as well as sleep deprivation for seizure provocation - seizure precautions - PRN versed for clinical seizures - Discussed overnight EEG findings with patient   Diabetes, uncontrolled - fingersticks qachs, insulin, metformin    Bipolar disorder - Continue home meds   Neuropathic pain - Hold gabapentin as it is also an anti-seizure med   Anxiety - Hold xanax as it is also an anti-seizure med   HLD - Continue Lipitor and fenofibrate   Hypomagnesemia - PO supplementation    Hyponatremia - due to elevated blood glucose , corrected is within normal limits   Cannabis use disorder - cessation counseling  I have spent a total of   36 minutes with the patient reviewing hospital notes,  test results, labs and examining the patient as well as establishing an  assessment and plan that was discussed personally with the patient.  > 50% of time was spent in direct patient care.   Zeb Comfort Epilepsy [facility] For questions after 5pm please refer to AMION to reach the Neurologist on call

## 2023-02-14 NOTE — Progress Notes (Signed)
Late entry: activations completed.

## 2023-02-14 NOTE — Telephone Encounter (Signed)
Patient Advocate Encounter   Received notification that prior authorization for [employer] 3 Sensor is required.   PA submitted on 02/14/2023 [facility] Insurance [employer] Electronic Prior Authorization Form 2017 NCPDP Status is pending       Lyndel Safe, [place] Patient Advocate Specialist [place] Patient Advocate Team Direct Number: [phone]  Fax: [phone]

## 2023-02-14 NOTE — Telephone Encounter (Signed)
Patient Advocate Encounter  Prior Authorization for [employer] 3 Sensor has been approved.    PA# [id number] Insurance [employer] Electronic Prior Authorization Form 2017 NCPDP Effective dates: 02/14/2023 through 02/14/2024  Patients co-pay is $0.00.     Lyndel Safe, [place] Patient Advocate Specialist [place] Patient Advocate Team Direct Number: [phone]  Fax: [phone]

## 2023-02-14 NOTE — Progress Notes (Signed)
Pt had a quiet night, no seizure activity observed, pt reassured. Victoria Quinn, Victoria Quinn

## 2023-02-14 NOTE — Progress Notes (Addendum)
Inpatient Diabetes Program Recommendations  AACE/ADA: New Consensus Statement on Inpatient Glycemic Control (2015)  Target Ranges:  Prepandial:   less than 140 mg/dL      Peak postprandial:   less than 180 mg/dL (1-2 hours)      Critically ill patients:  140 - 180 mg/dL     Review of Glycemic Control  Latest Reference Range & Units 02/13/23 16:37 02/13/23 21:50 02/14/23 06:45  Glucose-Capillary 70 - 99 mg/dL 286 (H) 327 (H) 275 (H)  Diabetes history: DM 1 since age 77- Last A1C=9.8% at MD visit Outpatient Diabetes medications:  Levemir 50 units daily Ozempic 1 mg q 7 days Metformin 1000 mg bid U500 (200-300 units with meals) Current orders for Inpatient glycemic control:  Metformin 1000 mg bid Novolog 0-20 units tid with meals and HS U500 100 units bid Inpatient Diabetes Program Recommendations:    Consider increasing U500 to 150 units bid. Also consider restarting Levemir 50 units daily.   Spoke with patient to let her know that FS Elenor Legato is preferred for CGM and Pre-authorization has been done for 0$ co-pay. She was very happy about this.  She states that she will be in the hospital until Friday?  Would prefer to wait to put sensor on until she is closer to leaving the hospital.  She states prior to admit, she was not checking her blood sugars at all.   I discussed that this could be dangerous  due to risks for both hypo and hyperglycemia.  She see's endocrinologist in [place], [place].  States she was prescribed Ozempic but her insurance denied.  Will continue to follow.  She appears to have great opportunities for glucose control.   Thanks,  Adah Perl, RN, BC-ADM Inpatient Diabetes Coordinator Pager [phone]  (8a-5p)

## 2023-02-15 DIAGNOSIS — R569 Unspecified convulsions: Secondary | ICD-10-CM | POA: Diagnosis not present

## 2023-02-15 LAB — GLUCOSE, CAPILLARY
Glucose-Capillary: 88 mg/dL (ref 70–99)
Glucose-Capillary: 138 mg/dL — ABNORMAL HIGH (ref 70–99)
Glucose-Capillary: 143 mg/dL — ABNORMAL HIGH (ref 70–99)
Glucose-Capillary: 173 mg/dL — ABNORMAL HIGH (ref 70–99)

## 2023-02-15 MED ORDER — IBUPROFEN 200 MG PO TABS
800.0000 mg | ORAL_TABLET | Freq: Once | ORAL | Status: AC
  Administered 2023-02-15: 800 mg via ORAL
  Filled 2023-02-15: qty 4

## 2023-02-15 MED ORDER — KETOROLAC TROMETHAMINE 15 MG/ML IJ SOLN
15.0000 mg | Freq: Once | INTRAMUSCULAR | Status: DC
  Filled 2023-02-15: qty 1

## 2023-02-15 NOTE — Procedures (Signed)
Patient Name: Victoria Quinn  MRN: [id number]  Epilepsy Attending: Lora Havens  Referring Physician/Provider: Lora Havens, MD  Duration: 02/14/2023 1048 to 02/15/2023 1048   Patient history: 27yo F admitted to EMU for characterization of seizure like episodes.EEG to evaluate for seizure   Level of alertness: Awake, asleep   AEDs during EEG study: None   Technical aspects: This EEG study was done with scalp electrodes positioned according to the 10-20 International system of electrode placement. Electrical activity was reviewed with band pass filter of 1-'70Hz'$ , sensitivity of 7 uV/mm, display speed of 37m/sec with a '60Hz'$  notched filter applied as appropriate. EEG data were recorded continuously and digitally stored.  Video monitoring was available and reviewed as appropriate.   Description: The posterior dominant rhythm consists of 9-10 Hz activity of moderate voltage (25-35 uV) seen predominantly in posterior head regions, symmetric and reactive to eye opening and eye closing. Sleep was characterized by vertex waves, sleep spindles (12 to 14 Hz), maximal frontocentral region.    IMPRESSION: This study is within normal limits. No seizures or epileptiform discharges were seen throughout the recording.   A normal interictal EEG does not exclude the diagnosis of epilepsy.     Prentiss Hammett OBarbra Sarks

## 2023-02-15 NOTE — Progress Notes (Signed)
Pt was able to stay awake till about 0330 and fell asleep, earlier c/o of headache, tab tylenol '650mg'$  given at [place], pt was however reassured, will continue to monitor. Obasogie-Asidi, Oscar Forman Efe

## 2023-02-15 NOTE — Progress Notes (Signed)
Subjective: No seizures. Headache improved with tylenol  ROS: negative except above  Examination  Vital signs in last 24 hours: Temp:  [97.6 F (36.4 C)-98.5 F (36.9 C)] 97.7 F (36.5 C) (02/28 0800) Pulse Rate:  [103-111] 107 (02/28 0800) Resp:  [15-18] 18 (02/28 0800) BP: (105-129)/(71-91) 129/91 (02/28 0800) SpO2:  [93 %-97 %] 97 % (02/28 0800)  General: lying in bed, NAD Neuro: AOx3, No aphasia, CN 2-12 grossly intact, spontaneously moving all extremities in bed  Basic Metabolic Panel: Recent Labs  Lab 02/13/23 1114  NA 132*  K 3.8  CL 96*  CO2 20*  GLUCOSE 382*  BUN 7  CREATININE 0.68  CALCIUM 8.9  MG 1.4*  PHOS 3.3    CBC: Recent Labs  Lab 02/13/23 1114  WBC 8.2  NEUTROABS 5.3  HGB 12.6  HCT 36.9  MCV 85.6  PLT 303     Coagulation Studies: Recent Labs    02/13/23 1114  LABPROT 13.2  INR 1.0    Imaging No new brain imaging    ASSESSMENT AND PLAN: 27yo F admitted to EMU for characterization of seizure like episodes.   Seizure like episodes - continue Video EEG for characterization of spells - hold xanax and gabapentin for spell provocation  - seizure precautions - PRN versed for clinical seizures - Discussed overnight EEG findings with patient   Diabetes, uncontrolled - fingersticks qachs, insulin, metformin    Bipolar disorder - Continue home meds   Neuropathic pain - Hold gabapentin as it is also an anti-seizure med   Anxiety - Hold xanax as it is also an anti-seizure med   HLD - Continue Lipitor and fenofibrate     I have spent a total of   26 minutes with the patient reviewing hospital notes,  test results, labs and examining the patient as well as establishing an assessment and plan that was discussed personally with the patient.  > 50% of time was spent in direct patient care.     Zeb Comfort Epilepsy [facility] For questions after 5pm please refer to AMION to reach the Neurologist on call

## 2023-02-16 DIAGNOSIS — R569 Unspecified convulsions: Secondary | ICD-10-CM | POA: Diagnosis not present

## 2023-02-16 LAB — GLUCOSE, CAPILLARY
Glucose-Capillary: 77 mg/dL (ref 70–99)
Glucose-Capillary: 141 mg/dL — ABNORMAL HIGH (ref 70–99)
Glucose-Capillary: 140 mg/dL — ABNORMAL HIGH (ref 70–99)
Glucose-Capillary: 75 mg/dL (ref 70–99)

## 2023-02-16 MED ORDER — AMITRIPTYLINE HCL 25 MG PO TABS
50.0000 mg | ORAL_TABLET | Freq: Every day | ORAL | Status: DC
  Administered 2023-02-16: 50 mg via ORAL
  Filled 2023-02-16: qty 2

## 2023-02-16 MED ORDER — IBUPROFEN 200 MG PO TABS
800.0000 mg | ORAL_TABLET | Freq: Once | ORAL | Status: AC
  Administered 2023-02-16: 800 mg via ORAL
  Filled 2023-02-16: qty 4

## 2023-02-16 NOTE — Progress Notes (Signed)
LTM maint complete - no skin breakdown seen , irritation under Fp1 Fp2, several leads serviced [facility] monitored.

## 2023-02-16 NOTE — Inpatient Diabetes Management (Signed)
Inpatient Diabetes Program Recommendations  AACE/ADA: New Consensus Statement on Inpatient Glycemic Control (2015)  Target Ranges:  Prepandial:   less than 140 mg/dL      Peak postprandial:   less than 180 mg/dL (1-2 hours)      Critically ill patients:  140 - 180 mg/dL   Lab Results  Component Value Date   GLUCAP 77 02/16/2023   HGBA1C (H) 09/27/2009    10.4 (NOTE) The ADA recommends the following therapeutic goal for glycemic control related to Hgb A1c measurement: Goal of therapy: <6.5 Hgb A1c  Reference: American Diabetes Association: Clinical Practice Recommendations 2010, Diabetes Care, 2010, 33: (Suppl  1).    Spoke with pt at bedside. Plan to place CGM [facility] 3) on pt tomorrow am prior to d/c.   Thanks,  Tama Headings RN, MSN, BC-ADM Inpatient Diabetes Coordinator Team Pager [phone] (8a-5p)

## 2023-02-16 NOTE — Procedures (Signed)
Ptient Name: Victoria Quinn  MRN: [id number]  Epilepsy Attending: Lora Havens  Referring Physician/Provider: Lora Havens, MD  Duration: 02/15/2023 1048 to 02/16/2023 1048   Patient history: 27yo F admitted to EMU for characterization of seizure like episodes.EEG to evaluate for seizure   Level of alertness: Awake, asleep   AEDs during EEG study: None   Technical aspects: This EEG study was done with scalp electrodes positioned according to the 10-20 International system of electrode placement. Electrical activity was reviewed with band pass filter of 1-'70Hz'$ , sensitivity of 7 uV/mm, display speed of 56m/sec with a '60Hz'$  notched filter applied as appropriate. EEG data were recorded continuously and digitally stored.  Video monitoring was available and reviewed as appropriate.   Description: The posterior dominant rhythm consists of 9-10 Hz activity of moderate voltage (25-35 uV) seen predominantly in posterior head regions, symmetric and reactive to eye opening and eye closing. Sleep was characterized by vertex waves, sleep spindles (12 to 14 Hz), maximal frontocentral region.    IMPRESSION: This study is within normal limits. No seizures or epileptiform discharges were seen throughout the recording.   A normal interictal EEG does not exclude the diagnosis of epilepsy.     [name]

## 2023-02-16 NOTE — Progress Notes (Signed)
   02/16/23 2105  Provider Notification  Provider Name/Title Dr Lorrin Goodell  Date Provider Notified 02/16/23  Time Provider Notified 2106  Method of Notification Page  Notification Reason Other (Comment) (Pt's CBG is 75, has schuduled Levemir 50unit tonight)  Provider response Other (Comment) (said to hold dose tonight)  Date of Provider Response 02/16/23  Time of Provider Response 2109

## 2023-02-16 NOTE — Progress Notes (Addendum)
Subjective: Victoria Quinn. No seizures. Did have headache, improved with ibuprofen  ROS: negative except above Examination  Vital signs in last 24 hours: Temp:  [97.3 F (36.3 C)-98.5 F (36.9 C)] 97.3 F (36.3 C) (02/29 0805) Pulse Rate:  [92-108] 105 (02/29 0805) Resp:  [15-26] 18 (02/29 0805) BP: (110-130)/(73-96) 114/78 (02/29 0805) SpO2:  [96 %-99 %] 99 % (02/29 0805)  General: lying in bed, NAD Neuro: AOx3, No aphasia, CN 2-12 grossly intact, spontaneously moving all extremities in bed  Basic Metabolic Panel: Recent Labs  Lab 02/13/23 1114  NA 132*  K 3.8  CL 96*  CO2 20*  GLUCOSE 382*  BUN 7  CREATININE 0.68  CALCIUM 8.9  MG 1.4*  PHOS 3.3   CBC: Recent Labs  Lab 02/13/23 1114  WBC 8.2  NEUTROABS 5.3  HGB 12.6  HCT 36.9  MCV 85.6  PLT 303   Coagulation Studies: No results for input(s): "LABPROT", "INR" in the last 72 hours.  Imaging No new brain imaging    ASSESSMENT AND PLAN: 27yo F admitted to EMU for characterization of seizure like episodes.   Seizure like episodes - continue Video EEG for characterization of spells - hold xanax and gabapentin for spell provocation  - seizure precautions - PRN versed for clinical seizures - Discussed overnight EEG findings with patient   Diabetes, uncontrolled - fingersticks qachs, insulin, metformin    Bipolar disorder - Continue home meds   Neuropathic pain - Hold gabapentin as it is also an anti-seizure med   Anxiety - Hold xanax as it is also an anti-seizure med   HLD - Continue Lipitor and fenofibrate  Headache - PO ibuprofen  Bilateral hip pain - due to being in bed - will allow to be in chair for some time and give ibuprofen     I have spent a total of   35 minutes with the patient reviewing hospital notes,  test results, labs and examining the patient as well as establishing an assessment and plan that was discussed personally with the patient.  > 50% of time was spent in direct patient  care.    Zeb Comfort Epilepsy [facility] For questions after 5pm please refer to AMION to reach the Neurologist on call

## 2023-02-17 DIAGNOSIS — F445 Conversion disorder with seizures or convulsions: Principal | ICD-10-CM

## 2023-02-17 LAB — GLUCOSE, CAPILLARY
Glucose-Capillary: 86 mg/dL (ref 70–99)
Glucose-Capillary: 92 mg/dL (ref 70–99)

## 2023-02-17 NOTE — TOC Transition Note (Signed)
Transition of Care [facility]) - CM/SW Discharge Note   Patient Details  Name: Victoria Quinn MRN: [id number] Date of Birth: 1996/07/26  [facility] CM/SW Contact:  Pollie Friar, RN Phone Number: 02/17/2023, 11:24 AM   Clinical Narrative:    Pt is discharging home with self care. No needs per TOC.   Final next level of care: Home/Self Care Barriers to Discharge: No Barriers Identified   Patient Goals and CMS Choice      Discharge Placement                         Discharge Plan and Services Additional resources added to the After Visit Summary for                                       Social Determinants of Health (SDOH) Interventions SDOH Screenings   Food Insecurity: No Food Insecurity (02/13/2023)  Housing: Low Risk  (02/13/2023)  Transportation Needs: No Transportation Needs (02/13/2023)  Utilities: Not At Risk (02/13/2023)  Tobacco Use: Unknown (02/13/2023)     Readmission Risk Interventions     No data to display

## 2023-02-17 NOTE — Discharge Instructions (Signed)
You were admitted to epilepsy monitoring unit between 02/13/2023 to 02/17/2023.  During this time, he underwent continuous video EEG monitoring, sleep deprivation, hyperventilation, photic stimulation were performed.  Xanax and gabapentin were held for seizure provocation.  No typical events were recorded.  EEG was within normal limits.  Given the history, semiology of episodes, normal MRI and EEG, these episodes are most likely nonepileptic.  Recommend follow-up with a therapist for cognitive behavioral therapy.  I would still recommend driving restrictions and seizure precautions until we get these episodes under control.

## 2023-02-17 NOTE — Discharge Summary (Signed)
Physician Discharge Summary  Patient ID: Victoria Quinn MRN: [id number] DOB/AGE: Jun 25, 1996 27 y.o.  Admit date: 02/13/2023 Discharge date: 02/17/2023  Admission Diagnoses: Seizure  Discharge Diagnoses: Psychogenic nonepileptic events  Discharged Condition: stable  Hospital Course: Ms. Harke was admitted to epilepsy monitoring unit from 02/13/2023 to 02/17/2023.  During this time, she underwent continuous video EEG monitoring.  Hyperventilation, photic stimulation, sleep deprivation were performed.  Xanax and gabapentin were held.  Noted milligrams were recorded.  EEG was within normal limits.  Given the history, semiology of episodes, normal MRI and EEG, these episodes are most likely nonepileptic.  Diagnosis was discussed with patient.  Recommended follow-up with psychiatry for cognitive behavioral therapy.  Patient understands and agrees with the diagnosis.  Seizure precautions including do not drive were still recommended until these episodes are controlled.  Consults: None  Significant Diagnostic Studies:   EEG Description: The posterior dominant rhythm consists of 9-10 Hz activity of moderate voltage (25-35 uV) seen predominantly in posterior head regions, symmetric and reactive to eye opening and eye closing. Sleep was characterized by vertex waves, sleep spindles (12 to 14 Hz), maximal frontocentral region.  Physiologic photic driving was seen during photic stimulation.  No EEG changes seen during hyperventilation.   IMPRESSION: This study is within normal limits. No seizures or epileptiform discharges were seen throughout the recording.   A normal interictal EEG does not exclude the diagnosis of epilepsy.  Treatments: No new medications  Discharge Exam: Blood pressure 134/87, pulse (!) 114, temperature 98.2 F (36.8 C), temperature source Oral, resp. rate 19, height '5\' 5"'$  (1.651 m), weight 89.6 kg, SpO2 99 %  General: lying in bed, NAD Neuro: AOx3, No aphasia, CN 2-12 grossly intact,  spontaneously moving all extremities in bed  Disposition: Discharge disposition: 01-Home or Self Care    Discharge Instructions     Call MD for:   Complete by: As directed    If patient has another seizure, call 911 and bring them back to the ED if: A.  The seizure lasts longer than 5 minutes.      B.  The patient doesn't wake shortly after the seizure or has new problems such as difficulty seeing, speaking or moving following the seizure C.  The patient was injured during the seizure D.  The patient has a temperature over 102 F (39C) E.  The patient vomited during the seizure and now is having trouble breathing     Diet - low sodium heart healthy   Complete by: As directed    Discharge instructions   Complete by: As directed    Seizure precautions: Per [place] statutes, patients with seizures are not allowed to drive until they have been seizure-free for six months and cleared by a physician    Use caution when using heavy equipment or power tools. Avoid working on ladders or at heights. Take showers instead of baths. Ensure the water temperature is not too high on the home water heater. Do not go swimming alone. Do not lock yourself in a room alone (i.e. bathroom). When caring for infants or small children, sit down when holding, feeding, or changing them to minimize risk of injury to the child in the event you have a seizure. Maintain good sleep hygiene. Avoid alcohol.      Increase activity slowly   Complete by: As directed       Allergies as of 02/17/2023       Reactions   Bee Venom Hives  Benadryl [diphenhydramine] Other (See Comments)   Not allergic per Pt.    Fenofibrate Other (See Comments)   GERD, N/V--high dose    Really bad heartburn   Humalog [insulin Lispro] Other (See Comments)   Kidney pain    Penicillins Hives   Temazepam Other (See Comments)   Suicidal   Venlafaxine Other (See Comments)   Depression    Epsom Salts [magnesium Sulfate] Rash    Seroquel [quetiapine] Anxiety   Panic attack   Toradol [ketorolac Tromethamine] Anxiety   Panic attacks         Medication List     STOP taking these medications    fenofibrate 145 MG tablet Commonly known as: TRICOR       TAKE these medications    acetaminophen 500 MG tablet Commonly known as: TYLENOL Take 1,000 mg by mouth as needed for moderate pain.   alprazolam 2 MG tablet Commonly known as: XANAX Take 2 mg by mouth at bedtime as needed for sleep.   amitriptyline 25 MG tablet Commonly known as: ELAVIL Take 50 mg by mouth at bedtime.   atorvastatin 20 MG tablet Commonly known as: LIPITOR Take 20 mg by mouth daily.   Biotin 10000 MCG Tabs Take 20,000 mcg by mouth daily.   Fenofibric Acid 135 MG Cpdr Take 135 mg by mouth daily.   gabapentin 300 MG capsule Commonly known as: NEURONTIN Take 600 mg by mouth daily.   HumuLIN R U-500 KwikPen 500 UNIT/ML KwikPen Generic drug: insulin regular human CONCENTRATED Inject 200 Units into the skin in the morning, at noon, and at bedtime.   Levemir FlexPen 100 UNIT/ML FlexPen Generic drug: insulin detemir Inject 50 Units into the skin daily.   meclizine 25 MG tablet Commonly known as: ANTIVERT Take 25 mg by mouth as needed for dizziness or nausea.   metFORMIN 500 MG 24 hr tablet Commonly known as: GLUCOPHAGE-XR Take 1,000 mg by mouth 2 (two) times daily with a meal.   mirtazapine 7.5 MG tablet Commonly known as: REMERON Take 7.5 mg by mouth at bedtime.   OLANZapine 7.5 MG tablet Commonly known as: ZYPREXA Take 7.5 mg by mouth at bedtime.   ondansetron 4 MG disintegrating tablet Commonly known as: ZOFRAN-ODT Take 4 mg by mouth as needed for nausea or vomiting.   Ozempic (0.25 or 0.5 MG/DOSE) 2 MG/1.5ML Sopn Generic drug: Semaglutide(0.25 or 0.'5MG'$ /DOS) Take 0.25 mg weekly x2, then 0.5 mg weekly x4, then 1 mg weekly thereafter.   Ozempic (1 MG/DOSE) 4 MG/3ML Sopn Generic drug: Semaglutide (1  MG/DOSE) Inject 1 mg into the skin every 7 (seven) days. Start after taper from 0.'25mg'$  Start taking on: March 08, 2023   PARoxetine 20 MG tablet Commonly known as: PAXIL Take 20 mg by mouth daily.   prazosin 1 MG capsule Commonly known as: MINIPRESS Take 1 mg by mouth at bedtime.       I have spent a total of  38  minutes with the patient reviewing hospital notes,  test results, labs and examining the patient as well as establishing an assessment and plan that was discussed personally with the patient.  > 50% of time was spent in direct patient care.     Signed: Lora Havens 02/17/2023, 11:06 AM

## 2023-02-17 NOTE — Progress Notes (Signed)
LTM maint complete - no skin breakdown under: a2, fp1

## 2023-02-17 NOTE — Progress Notes (Signed)
EMU EEG discontinued - no skin breakdown at [facility].

## 2023-02-17 NOTE — Progress Notes (Signed)
Discharge instructions discussed with patient. All questions answered. Home meds returned from pharmacy. Patient belongings returned to patient.  Gwendolyn Grant, RN

## 2023-02-17 NOTE — Procedures (Addendum)
Patient Name: Victoria Quinn  MRN: [id number]  Epilepsy Attending: Lora Havens  Referring Physician/Provider: Lora Havens, MD  Duration: 02/16/2023 1048 to 02/17/2023 1107   Patient history: 27yo F admitted to EMU for characterization of seizure like episodes.EEG to evaluate for seizure   Level of alertness: Awake, asleep   AEDs during EEG study: None   Technical aspects: This EEG study was done with scalp electrodes positioned according to the 10-20 International system of electrode placement. Electrical activity was reviewed with band pass filter of 1-'70Hz'$ , sensitivity of 7 uV/mm, display speed of 30m/sec with a '60Hz'$  notched filter applied as appropriate. EEG data were recorded continuously and digitally stored.  Video monitoring was available and reviewed as appropriate.   Description: The posterior dominant rhythm consists of 9-10 Hz activity of moderate voltage (25-35 uV) seen predominantly in posterior head regions, symmetric and reactive to eye opening and eye closing. Sleep was characterized by vertex waves, sleep spindles (12 to 14 Hz), maximal frontocentral region.    IMPRESSION: This study is within normal limits. No seizures or epileptiform discharges were seen throughout the recording.   A normal interictal EEG does not exclude the diagnosis of epilepsy.     Kaitlynn Tramontana OBarbra Sarks

## 2024-03-21 IMAGING — MR MR HEAD WO/W CM
14 of 15 series · 46 of 48 positions shown · IV contrast (multihance)
Comparison: CT head 04/08/2022, MR head 06/16/2006

CLINICAL DATA: Convulsions free air weakness, numbness, difficulty
walking and talking

EXAM:
MRI HEAD WITHOUT AND WITH CONTRAST
TECHNIQUE: Multiplanar, multiecho pulse sequences of the brain and surrounding
structures were obtained without and with intravenous contrast.
CONTRAST:  20mL MULTIHANCE GADOBENATE DIMEGLUMINE 529 MG/ML IV SOLN

[Series 2: T1 · sagittal · 5.0mm · 0.47mm/px · 1 of 24 slices shown]
[im 1/24]
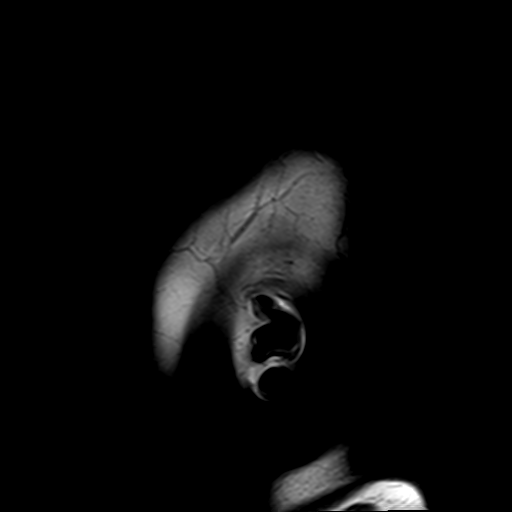

[Series 3: DWI · axial · 3.0mm · 1.80mm/px · z∈[-42,+99]mm · 5 of 96 slices shown (1 of 4)]
[im 1/96]
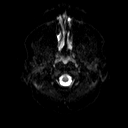
[im 24/96]
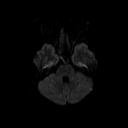
[im 48/96]
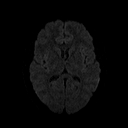
[im 72/96]
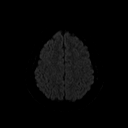
[im 96/96]
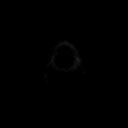

[Series 4: DWI · axial · 3.0mm · 1.80mm/px · z∈[-42,+99]mm · 2 of 48 slices shown (2 of 4)]
[im 1/48]
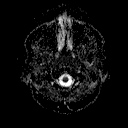
[im 48/48]
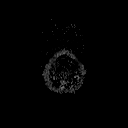

[Series 5: DWI · coronal · 5.0mm · 1.80mm/px · 4 of 70 slices shown (3 of 4)]
[im 1/70]
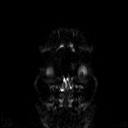
[im 24/70]
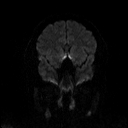
[im 47/70]
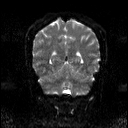
[im 70/70]
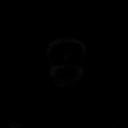

[Series 6: DWI · coronal · 5.0mm · 1.80mm/px · 2 of 35 slices shown (4 of 4)]
[im 1/35]
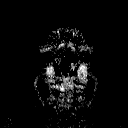
[im 35/35]
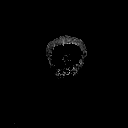

[Series 7: T2 · axial · 5.0mm · 0.72mm/px · 1 of 22 slices shown (1 of 3)]
[im 1/22]
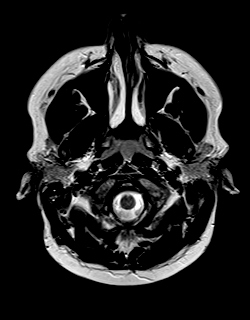

[Series 8: FLAIR · axial · 3.0mm · 0.45mm/px · z∈[-43,+100]mm · 2 of 32 slices shown (1 of 2)]
[im 1/32]
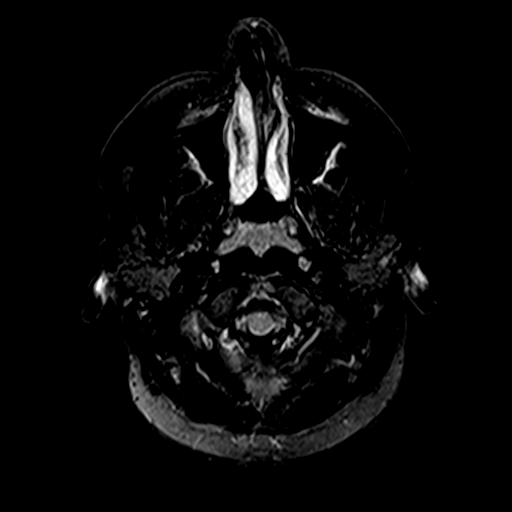
[im 32/32]
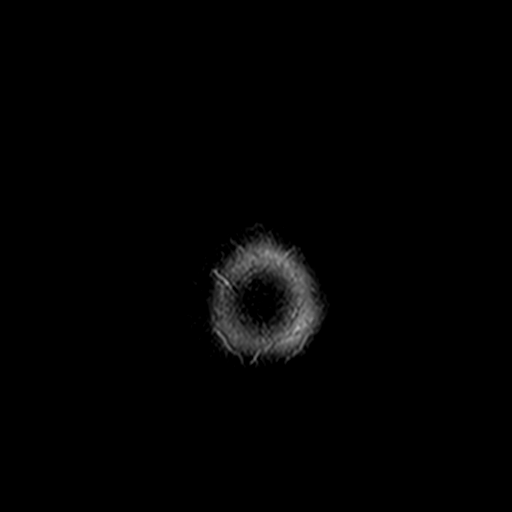

[Series 10: swi_images · axial · 4.0mm · 0.90mm/px · z∈[-41,+98]mm · 2 of 36 slices shown]
[im 1/36]
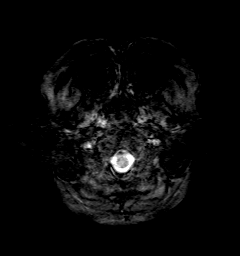
[im 36/36]
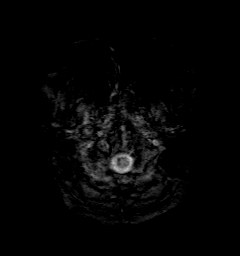

[Series 11: t1_mpr_tra · axial · 1.0mm · 0.72mm/px · z∈[-43,+100]mm · 7 of 144 slices shown]
[im 1/144]
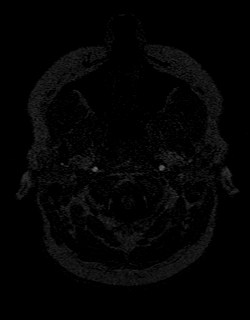
[im 24/144]
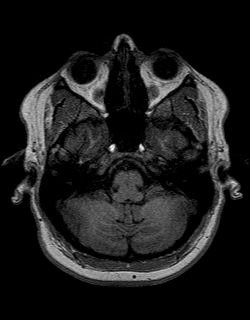
[im 48/144]
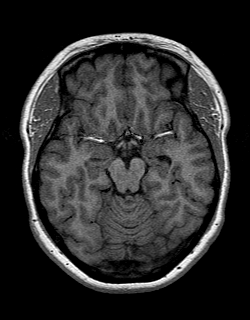
[im 72/144]
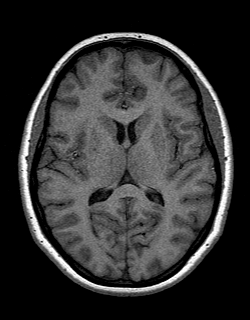
[im 96/144]
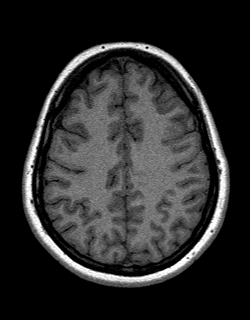
[im 120/144]
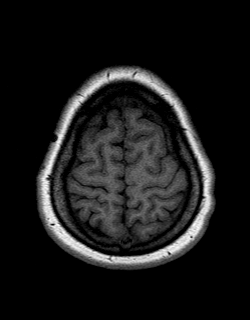
[im 144/144]
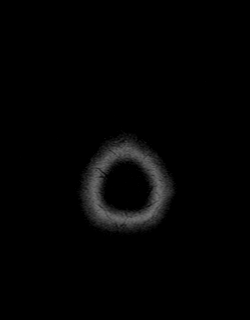

[Series 12: T2 · coronal · 3.0mm · 0.28mm/px · 2 of 39 slices shown (2 of 3)]
[im 1/39]
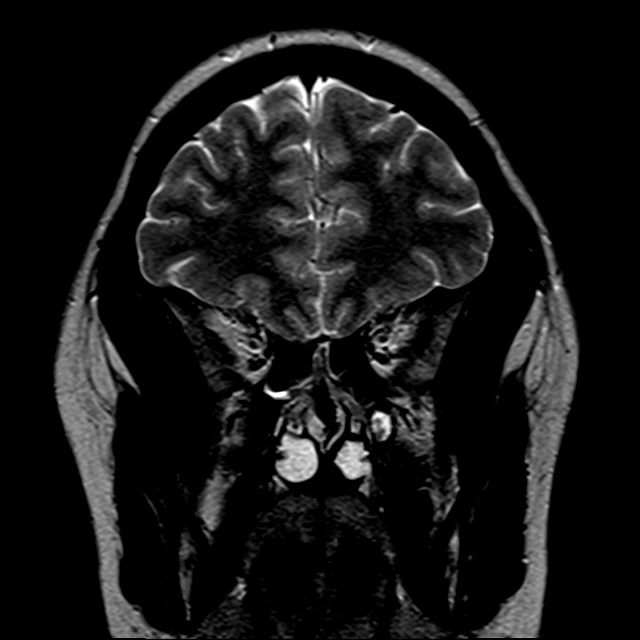
[im 39/39]
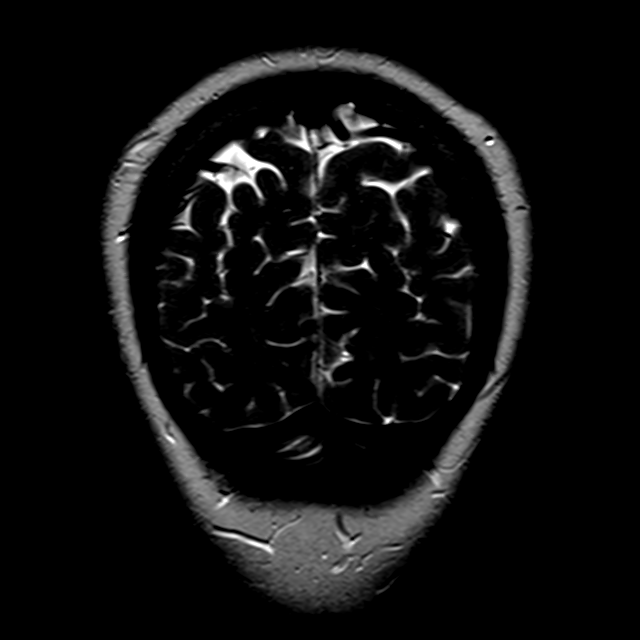

[Series 13: cor mpr · coronal · 1.0mm · 0.72mm/px · 8 of 170 slices shown]
[im 1/170]
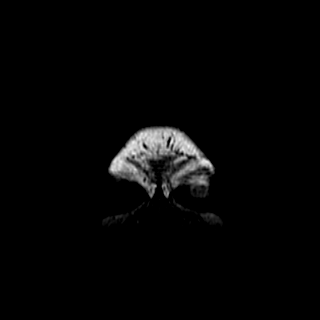
[im 22/170]
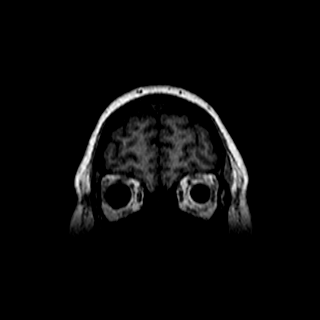
[im 43/170]
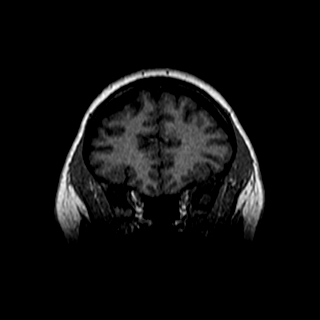
[im 64/170]
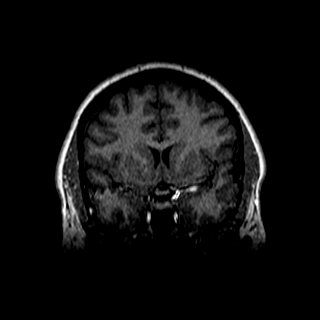
[im 106/170]
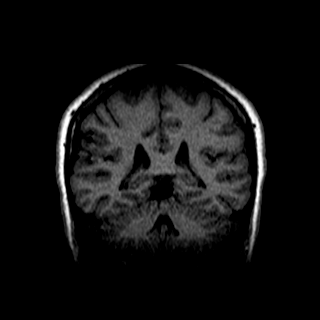
[im 127/170]
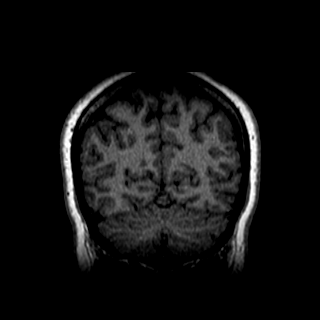
[im 148/170]
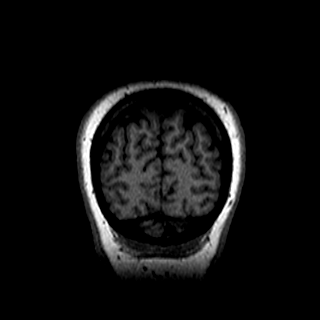
[im 170/170]
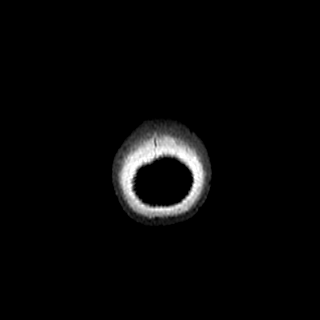

[Series 14: FLAIR · coronal · 3.0mm · 0.35mm/px · 2 of 39 slices shown (2 of 2)]
[im 1/39]
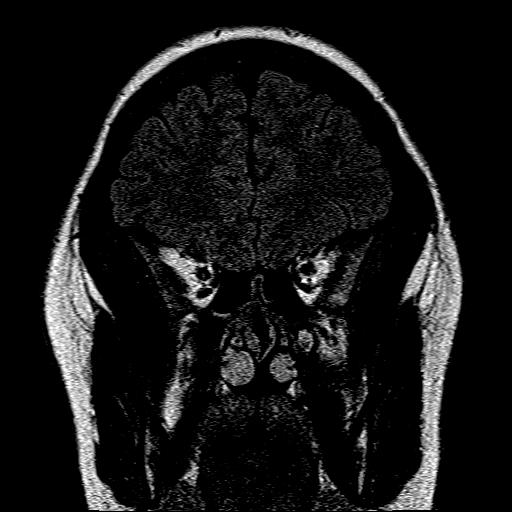
[im 39/39]
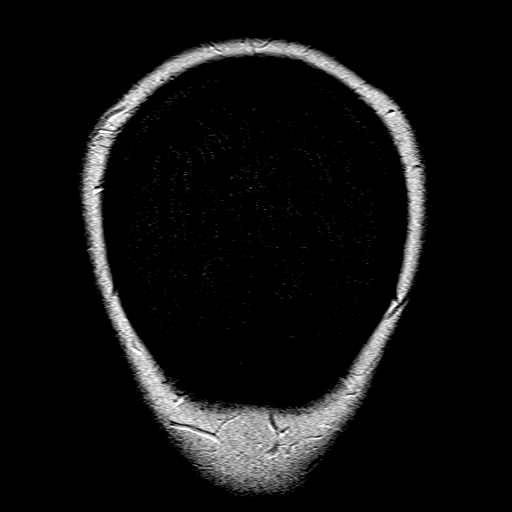

[Series 15: T2 · coronal · 5.0mm · 0.45mm/px · 1 of 27 slices shown (3 of 3)]
[im 1/27]
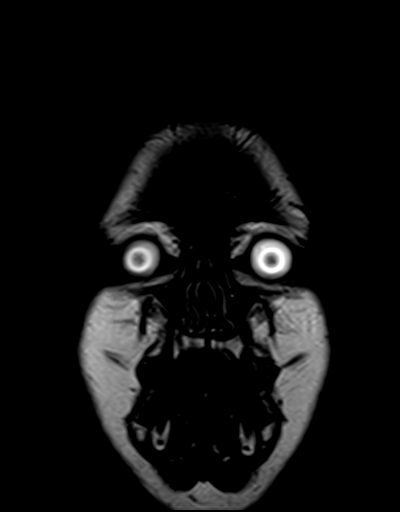

[Series 16: t1_mpr_tra post · axial · 1.0mm · 0.72mm/px · z∈[-43,+100]mm · 7 of 144 slices shown]
[im 1/144]
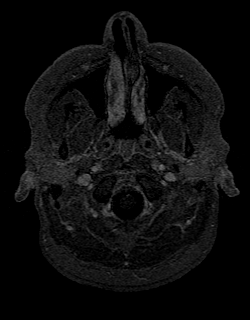
[im 24/144]
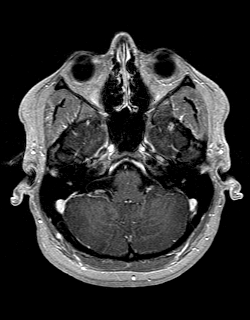
[im 48/144]
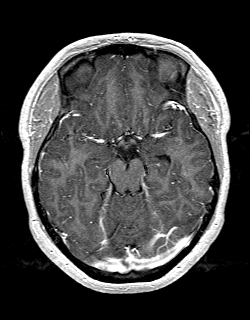
[im 72/144]
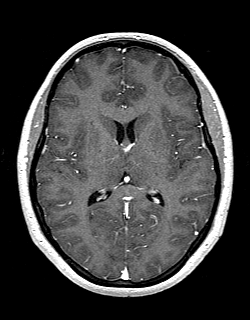
[im 96/144]
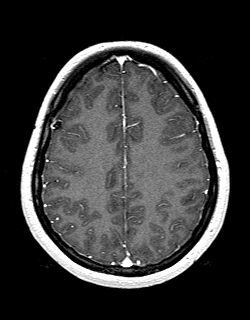
[im 120/144]
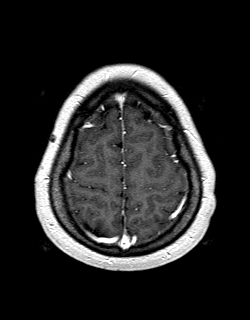
[im 144/144]
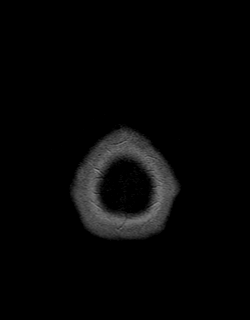

[46 of 48 positions shown; findings below may reference images not displayed]

FINDINGS: Brain: There is no acute intracranial hemorrhage, extra-axial fluid
collection, or acute infarct.

Parenchymal volume is normal. The ventricles are normal in size.
Gray-white differentiation is preserved. Parenchymal signal is
normal. There is no structural or migration abnormality. The
hippocampi are normal in signal and architecture.

There is no abnormal parenchymal enhancement. There is no mass
effect or midline shift.

There is a 4 mm focus of hypoenhancement in the right aspect of the
pituitary suspicious for a pituitary micro adenoma (16-35).

Vascular: Normal flow voids.

Skull and upper cervical spine: Normal marrow signal.

Sinuses/Orbits: There is mild mucosal thickening in the left
sphenoid sinus. The globes and orbits are unremarkable.

Other: None.
IMPRESSION: 1. No acute intracranial pathology or epileptogenic focus
identified.
2. 4 mm focus of hypoenhancement in the right aspect of the sella
suspicious for a micro adenoma. Correlate with pituitary function
tests.
